# Patient Record
Sex: Male | Born: 1995 | Race: Black or African American | Hispanic: No | Marital: Single
Health system: Southern US, Community
[De-identification: ages and names within clinical notes are randomized; demographics above are authoritative.]

---
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
## 2021-06-02 ENCOUNTER — Emergency Department: Payer: Self-pay

## 2021-06-02 ENCOUNTER — Emergency Department
Admission: EM | Admit: 2021-06-02 | Discharge: 2021-06-02 | Disposition: A | Discharge status: Home / Self Care | Payer: Self-pay | Attending: Emergency Medicine | Admitting: Emergency Medicine

## 2021-06-02 ENCOUNTER — Encounter: Payer: Self-pay | Admitting: Emergency Medicine

## 2021-06-02 ENCOUNTER — Other Ambulatory Visit: Payer: Self-pay

## 2021-06-02 DIAGNOSIS — G8929 Other chronic pain: Secondary | ICD-10-CM

## 2021-06-02 DIAGNOSIS — M25562 Pain in left knee: Secondary | ICD-10-CM | POA: Insufficient documentation

## 2021-06-02 MED ORDER — MELOXICAM 15 MG PO TABS: 15.0000 mg | ORAL_TABLET | Freq: Every day | ORAL | 0 refills | Status: AC

## 2021-06-02 NOTE — ED Triage Notes (Signed)
Patient reports knee pain x 3 years. Patient reports worsening pain this week after swimming.

## 2021-06-02 NOTE — ED Notes (Signed)
See triage note  Presents with left knee pain  States initial injury was about 3 years ago  States this pain started a few days ago  Pain is mainly to lateral aspect of knee

## 2021-06-02 NOTE — Discharge Instructions (Addendum)
Call make an appointment with Dr. Martha Clan who is the orthopedist on-call.  His office information and phone number listed on your discharge papers.  Begin taking meloxicam 1 daily with food.  Ice as needed for knee pain.

## 2021-06-02 NOTE — ED Provider Notes (Signed)
[facility] Emergency Department Provider Note   ____________________________________________   None    (approximate)  I have reviewed the triage vital signs and the nursing notes.   HISTORY  Chief Complaint Knee Pain    HPI Brandon Macdonald is a 25 y.o. male presents to the ED with complaint of left knee pain for 3 years.  Patient states that his pain began getting worse this week after swimming.  He denies any direct trauma to his knee.  Patient continues to ambulate without any assistance.  No over-the-counter medication has been taken.  Currently rates pain as 6 out of 10.       History reviewed. No pertinent past medical history.  There are no problems to display for this patient.   Prior to Admission medications   Medication Sig Start Date End Date Taking? Authorizing Provider  meloxicam (MOBIC) 15 MG tablet Take 1 tablet (15 mg total) by mouth daily. 06/02/21 06/02/22 Yes Tommi Rumps, PA-C    Allergies Patient has no allergy information on record.  History reviewed. No pertinent family history.  Social History    Review of Systems Constitutional: No fever/chills Cardiovascular: Denies chest pain. Respiratory: Denies shortness of breath. Gastrointestinal:   No nausea, no vomiting.  No diarrhea.  Musculoskeletal: Positive left knee pain. Skin: Negative for rash. Neurological: Negative for headaches, focal weakness or numbness.  ____________________________________________   PHYSICAL EXAM:  VITAL SIGNS: ED Triage Vitals  Enc Vitals Group     BP 06/02/21 0112 (!) 157/81     Pulse Rate 06/02/21 0112 70     Resp 06/02/21 0112 16     Temp 06/02/21 0112 98.3 F (36.8 C)     Temp Source 06/02/21 0112 Oral     SpO2 06/02/21 0112 99 %     Weight 06/02/21 0113 185 lb (83.9 kg)     Height 06/02/21 0113 6' (1.829 m)     Head Circumference --      Peak Flow --      Pain Score 06/02/21 0113 6     Pain Loc --      Pain Edu? --       Excl. in GC? --     Constitutional: Alert and oriented. Well appearing and in no acute distress. Eyes: Conjunctivae are normal.  Head: Atraumatic. Neck: No stridor.   Cardiovascular: Normal rate, regular rhythm. Grossly normal heart sounds.  Good peripheral circulation. Respiratory: Normal respiratory effort.  No retractions. Lungs CTAB. Musculoskeletal: Examination of left knee there is no gross deformity and no soft tissue edema.  No effusion is noted.  Range of motion is minimally restricted secondary to discomfort.  There is on palpation moderate discomfort on the medial aspect without effusion.  No crepitus is noted.  Ligaments are stable bilaterally.  Skin is intact and no erythema or abrasions are noted. Neurologic:  Normal speech and language. No gross focal neurologic deficits are appreciated. No gait instability. Skin:  Skin is warm, dry and intact. No rash noted. Psychiatric: Mood and affect are normal. Speech and behavior are normal.  ____________________________________________   LABS (all labs ordered are listed, but only abnormal results are displayed)  Labs Reviewed - No data to display ____________________________________________ ____________________________________________  RADIOLOGY Beaulah Corin, personally viewed and evaluated these images (plain radiographs) as part of my medical decision making, as well as reviewing the written report by the radiologist.   Official radiology report(s): DG Knee Complete 4 Views Left  Result Date: 06/02/2021 CLINICAL DATA:  Knee pain for 3 years. EXAM: LEFT KNEE - COMPLETE 4+ VIEW COMPARISON:  None. FINDINGS: No joint effusion. There is sharpening of the tibial spines. No fracture or dislocation. Soft tissues unremarkable. IMPRESSION: Mild degenerative change. Electronically Signed   By: Signa Kell M.D.   On: 06/02/2021 08:12    ____________________________________________   PROCEDURES  Procedure(s) performed  (including Critical Care):  Procedures   ____________________________________________   INITIAL IMPRESSION / ASSESSMENT AND PLAN / ED COURSE  As part of my medical decision making, I reviewed the following data within the electronic MEDICAL RECORD NUMBER Notes from prior ED visits and [place] Controlled Substance Database  25 year old male presents to the ED with complaint of left knee pain for approximately 3 years.  Patient states he has not had any new injury but reports that it did hurt more after swimming this weekend.  He has not taken any over-the-counter medication prior to his visit today.  Physical exam is benign with exception of some tenderness on the medial aspect of his left knee.  X-rays show mild degenerative changes.  Patient was given a prescription for meloxicam to take daily for the next 2 weeks.  Is to follow-up with Dr. Martha Clan who is on-call for orthopedics if any continued problems. ____________________________________________   FINAL CLINICAL IMPRESSION(S) / ED DIAGNOSES  Final diagnoses:  Chronic pain of left knee     ED Discharge Orders          Ordered    meloxicam (MOBIC) 15 MG tablet  Daily        06/02/21 0847             Note:  This document was prepared using Dragon voice recognition software and may include unintentional dictation errors.    Tommi Rumps, PA-C 06/02/21 1037    Merwyn Katos, MD 06/03/21 1315

## 2022-12-12 IMAGING — CR DG KNEE COMPLETE 4+V*L*
4 series · 4 of 4 positions shown · non-contrast
Comparison: None.

CLINICAL DATA: Knee pain for 3 years.

EXAM:
LEFT KNEE - COMPLETE 4+ VIEW

[knee ap]
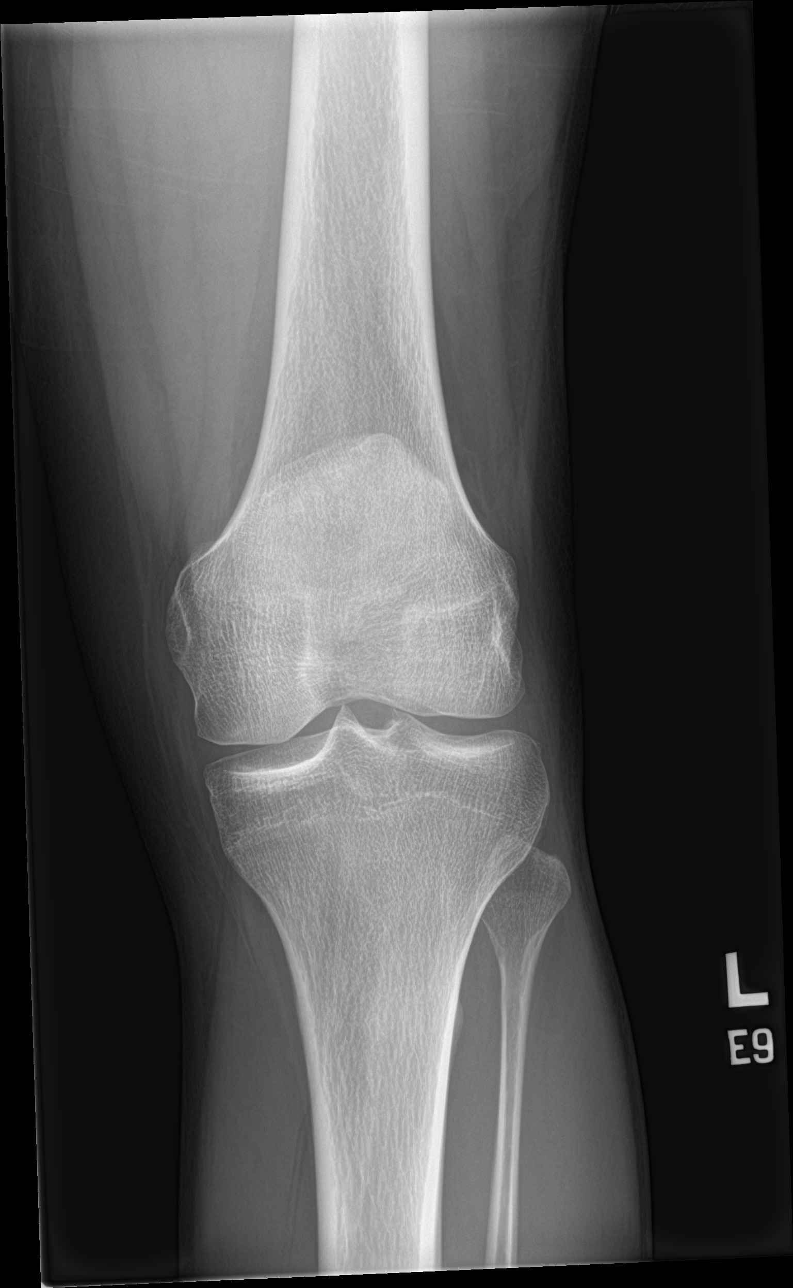

[knee obl (1 of 2)]
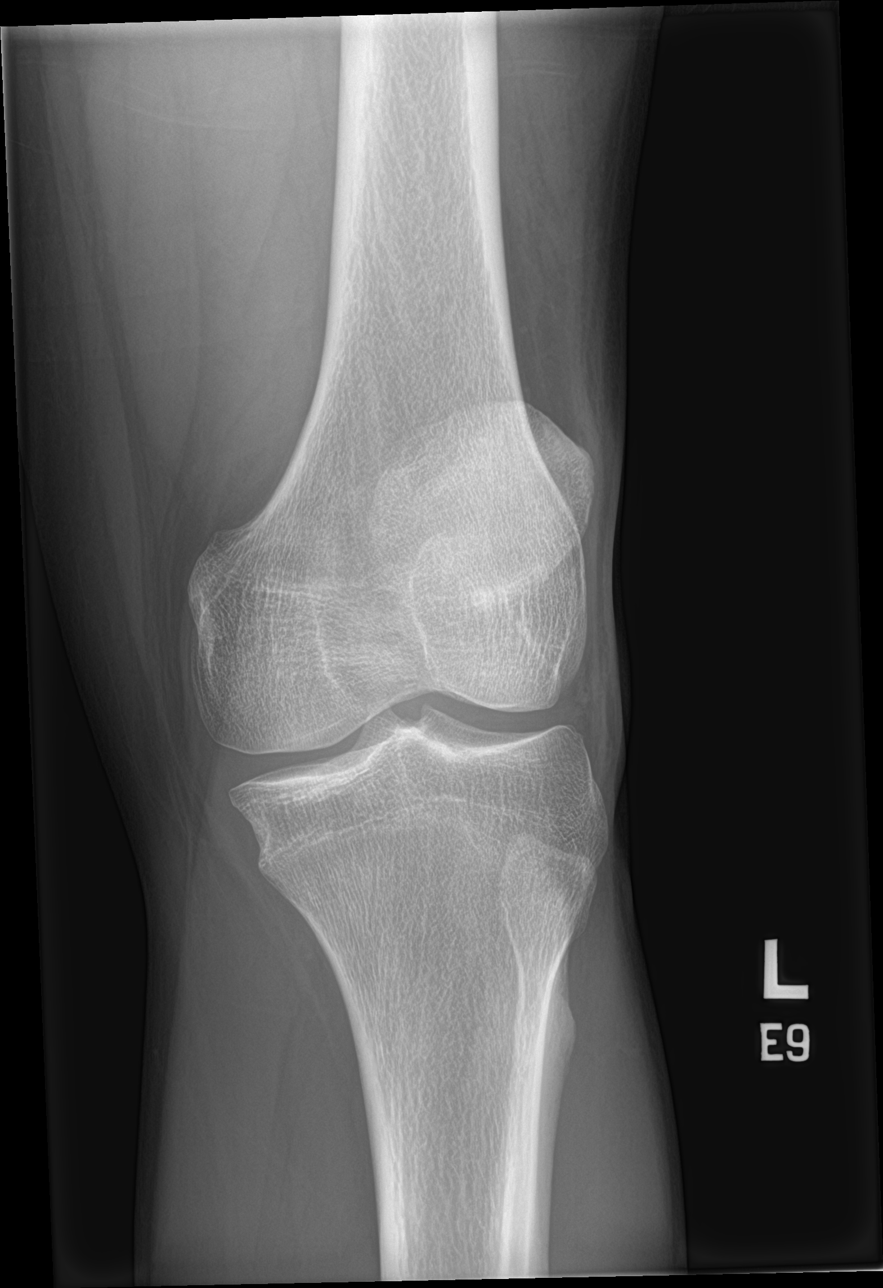

[knee obl (2 of 2)]
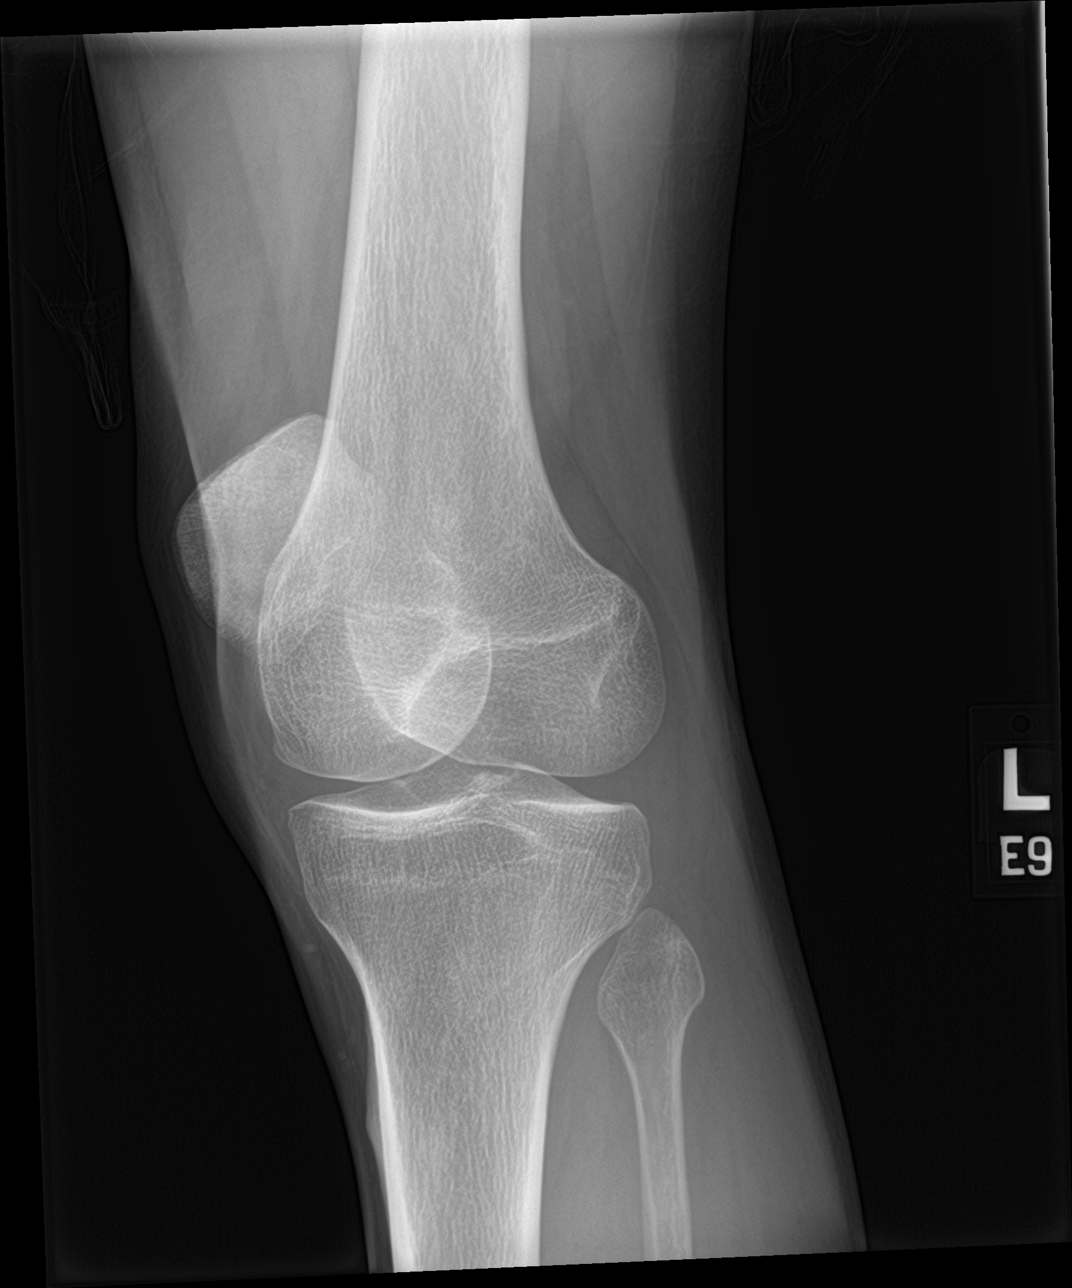

[knee lat]
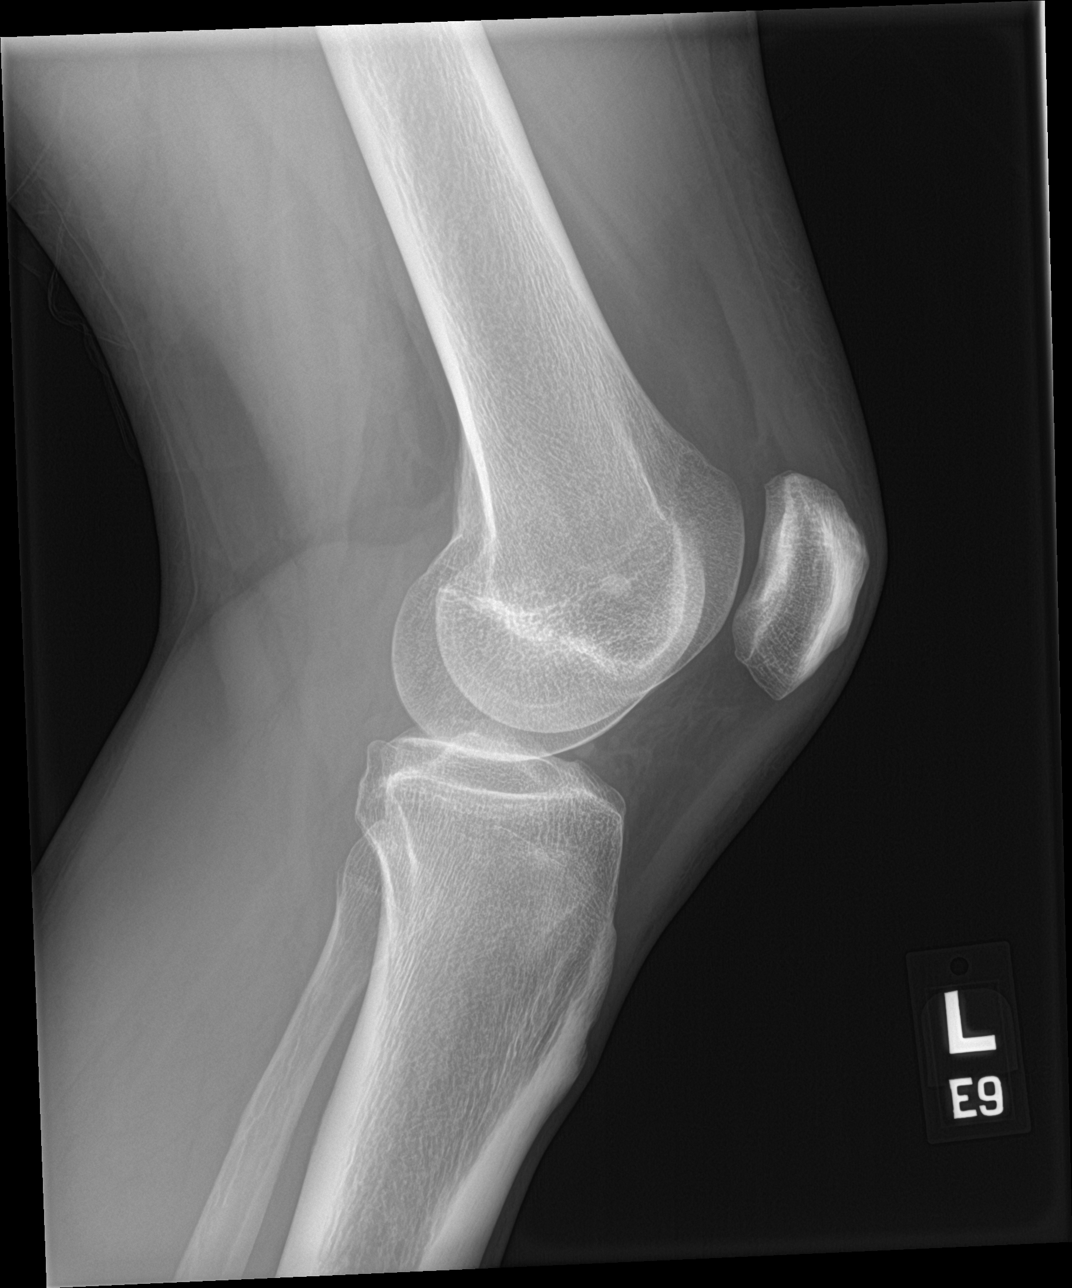

[4 of 4 positions shown; findings below may reference images not displayed]

FINDINGS: No joint effusion. There is sharpening of the tibial spines. No
fracture or dislocation. Soft tissues unremarkable.
IMPRESSION: Mild degenerative change.
